# Patient Record
Sex: Male | Born: 1980 | Race: Black or African American | Hispanic: No | Marital: Single | Smoking: Never smoker
Health system: Southern US, Community
[De-identification: ages and names within clinical notes are randomized; demographics above are authoritative.]

---

## 2002-07-11 ENCOUNTER — Emergency Department (HOSPITAL_COMMUNITY): Admission: EM | Admit: 2002-07-11 | Discharge: 2002-07-11 | Payer: Self-pay | Admitting: Emergency Medicine

## 2002-11-01 ENCOUNTER — Emergency Department (HOSPITAL_COMMUNITY): Admission: EM | Admit: 2002-11-01 | Discharge: 2002-11-02 | Payer: Self-pay | Admitting: Emergency Medicine

## 2004-12-15 ENCOUNTER — Emergency Department (HOSPITAL_COMMUNITY): Admission: EM | Admit: 2004-12-15 | Discharge: 2004-12-15 | Payer: Self-pay | Admitting: Emergency Medicine

## 2006-02-18 ENCOUNTER — Emergency Department (HOSPITAL_COMMUNITY): Admission: EM | Admit: 2006-02-18 | Discharge: 2006-02-19 | Payer: Self-pay | Admitting: Emergency Medicine

## 2011-12-05 ENCOUNTER — Emergency Department (HOSPITAL_COMMUNITY)
Admission: EM | Admit: 2011-12-05 | Discharge: 2011-12-05 | Disposition: A | Discharge status: Home / Self Care | Payer: Self-pay | Attending: Emergency Medicine | Admitting: Emergency Medicine

## 2011-12-05 ENCOUNTER — Encounter (HOSPITAL_COMMUNITY): Payer: Self-pay | Admitting: Emergency Medicine

## 2011-12-05 DIAGNOSIS — S0101XA Laceration without foreign body of scalp, initial encounter: Secondary | ICD-10-CM

## 2011-12-05 DIAGNOSIS — Y9229 Other specified public building as the place of occurrence of the external cause: Secondary | ICD-10-CM | POA: Insufficient documentation

## 2011-12-05 DIAGNOSIS — W268XXA Contact with other sharp object(s), not elsewhere classified, initial encounter: Secondary | ICD-10-CM | POA: Insufficient documentation

## 2011-12-05 DIAGNOSIS — S0100XA Unspecified open wound of scalp, initial encounter: Secondary | ICD-10-CM | POA: Insufficient documentation

## 2011-12-05 NOTE — ED Notes (Signed)
Pt. Discharged to home NAD noted 
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
## 2011-12-05 NOTE — ED Notes (Signed)
Pt presented to the ER with c/o laceration to the left side behind the ear, bleeding controled, laceration is 3 cm, noted knot to the affected area as well.

## 2011-12-05 NOTE — ED Notes (Signed)
Left side head laceration

## 2011-12-05 NOTE — Discharge Instructions (Signed)
You were seen and treated today for your scalp laceration. Your wound was cleaned with irrigation. Your wound was also stapled to help control bleeding and help with healing. He'll need to have your staples removed in 10 days. Ear, an urgent care clinic or return to the emergency room to have your staples removed. If you develop any bleeding, drainage or discharge from a laceration, increased pain, fever, chills please return to emergency room sooner for possible infection.   Laceration Care, Adult A laceration is a cut or lesion that goes through all layers of the skin and into the tissue just beneath the skin. TREATMENT  Some lacerations may not require closure. Some lacerations may not be able to be closed due to an increased risk of infection. It is important to see your caregiver as soon as possible after an injury to minimize the risk of infection and maximize the opportunity for successful closure. If closure is appropriate, pain medicines may be given, if needed. The wound will be cleaned to help prevent infection. Your caregiver will use stitches (sutures), staples, wound glue (adhesive), or skin adhesive strips to repair the laceration. These tools bring the skin edges together to allow for faster healing and a better cosmetic outcome. However, all wounds will heal with a scar. Once the wound has healed, scarring can be minimized by covering the wound with sunscreen during the day for 1 full year. HOME CARE INSTRUCTIONS  For sutures or staples:  Keep the wound clean and dry.   If you were given a bandage (dressing), you should change it at least once a day. Also, change the dressing if it becomes wet or dirty, or as directed by your caregiver.   Wash the wound with soap and water 2 times a day. Rinse the wound off with water to remove all soap. Pat the wound dry with a clean towel.   After cleaning, apply a thin layer of the antibiotic ointment as recommended by your caregiver. This will  help prevent infection and keep the dressing from sticking.   You may shower as usual after the first 24 hours. Do not soak the wound in water until the sutures are removed.   Only take over-the-counter or prescription medicines for pain, discomfort, or fever as directed by your caregiver.   Get your sutures or staples removed as directed by your caregiver.  For skin adhesive strips:  Keep the wound clean and dry.   Do not get the skin adhesive strips wet. You may bathe carefully, using caution to keep the wound dry.   If the wound gets wet, pat it dry with a clean towel.   Skin adhesive strips will fall off on their own. You may trim the strips as the wound heals. Do not remove skin adhesive strips that are still stuck to the wound. They will fall off in time.  For wound adhesive:  You may briefly wet your wound in the shower or bath. Do not soak or scrub the wound. Do not swim. Avoid periods of heavy perspiration until the skin adhesive has fallen off on its own. After showering or bathing, gently pat the wound dry with a clean towel.   Do not apply liquid medicine, cream medicine, or ointment medicine to your wound while the skin adhesive is in place. This may loosen the film before your wound is healed.   If a dressing is placed over the wound, be careful not to apply tape directly over the skin adhesive.  This may cause the adhesive to be pulled off before the wound is healed.   Avoid prolonged exposure to sunlight or tanning lamps while the skin adhesive is in place. Exposure to ultraviolet light in the first year will darken the scar.   The skin adhesive will usually remain in place for 5 to 10 days, then naturally fall off the skin. Do not pick at the adhesive film.  You may need a tetanus shot if:  You cannot remember when you had your last tetanus shot.   You have never had a tetanus shot.  If you get a tetanus shot, your arm may swell, get red, and feel warm to the touch.  This is common and not a problem. If you need a tetanus shot and you choose not to have one, there is a rare chance of getting tetanus. Sickness from tetanus can be serious. SEEK MEDICAL CARE IF:   You have redness, swelling, or increasing pain in the wound.   You see a red line that goes away from the wound.   You have yellowish-white fluid (pus) coming from the wound.   You have a fever.   You notice a bad smell coming from the wound or dressing.   Your wound breaks open before or after sutures have been removed.   You notice something coming out of the wound such as wood or glass.   Your wound is on your hand or foot and you cannot move a finger or toe.  SEEK IMMEDIATE MEDICAL CARE IF:   Your pain is not controlled with prescribed medicine.   You have severe swelling around the wound causing pain and numbness or a change in color in your arm, hand, leg, or foot.   Your wound splits open and starts bleeding.   You have worsening numbness, weakness, or loss of function of any joint around or beyond the wound.   You develop painful lumps near the wound or on the skin anywhere on your body.  MAKE SURE YOU:   Understand these instructions.   Will watch your condition.   Will get help right away if you are not doing well or get worse.  Document Released: 08/10/2005 Document Revised: 07/30/2011 Document Reviewed: 02/03/2011 [facility] Patient Information 2012 [place], [place].   Staple Wound Closure Your wound has been cleaned and closed with skin staples. HOME CARE  Keep the area around the staples clean and dry.   Rest and raise (elevate) the injured part above the level of your heart.   See your doctor for a follow-up check of the wound.   See your doctor to have the staples removed.   As the wound heals, you may leave it open to the air and clean it daily with water.   Do not soak the wound in water for long periods of time.   Watch for signs of a wound infection:    Unusual redness or puffiness around the wound.   Increasing pain or tenderness.   Yellowish white fluid (pus) coming from the wound.  You may need a tetanus shot if:  You cannot remember when you had your last tetanus shot.   You have never had a tetanus shot.   The injury broke your skin.  If you need a tetanus shot and you choose not to have one, you may get tetanus. Sickness from tetanus can be serious. GET HELP RIGHT AWAY IF:   You think the wound is infected.   The wound  does not stay together after the staples have been taken out.   Something comes out of the wound, such as wood or glass.   You or your child has problems moving the injured area.   You or your child has a temperature by mouth above 102 F (38.9 C), not controlled by medicine.  MAKE SURE YOU:   Understand these instructions.   Will watch this condition.   Will get help right away if you or your child is not doing well or gets worse.  Document Released: 05/19/2008 Document Revised: 07/30/2011 Document Reviewed: 08/08/2008 [facility] Patient Information 2012 [place], [place].

## 2011-12-05 NOTE — ED Notes (Signed)
Received pt. From triage, pt. Alert and oriented, pt. Denies LOC, NAD noted

## 2011-12-06 NOTE — ED Provider Notes (Signed)
Medical screening examination/treatment/procedure(s) were performed by non-physician practitioner and as supervising physician I was immediately available for consultation/collaboration.   Carlissa Pesola D Eberardo Demello, MD 12/06/11 0951 

## 2015-11-03 ENCOUNTER — Encounter (HOSPITAL_COMMUNITY): Payer: Self-pay | Admitting: *Deleted

## 2015-11-03 ENCOUNTER — Emergency Department (INDEPENDENT_AMBULATORY_CARE_PROVIDER_SITE_OTHER)
Admission: EM | Admit: 2015-11-03 | Discharge: 2015-11-03 | Disposition: A | Discharge status: Home / Self Care | Payer: Self-pay | Source: Home / Self Care

## 2015-11-03 DIAGNOSIS — M545 Low back pain, unspecified: Secondary | ICD-10-CM

## 2015-11-03 MED ORDER — KETOROLAC TROMETHAMINE 60 MG/2ML IM SOLN
60.0000 mg | Freq: Once | INTRAMUSCULAR | Status: AC
  Administered 2015-11-03: 60 mg via INTRAMUSCULAR

## 2015-11-03 MED ORDER — KETOROLAC TROMETHAMINE 60 MG/2ML IM SOLN
INTRAMUSCULAR | Status: AC
  Filled 2015-11-03: qty 2

## 2015-11-03 MED ORDER — METHYLPREDNISOLONE 4 MG PO TBPK: ORAL_TABLET | ORAL | Status: AC

## 2015-11-03 MED ORDER — CYCLOBENZAPRINE HCL 10 MG PO TABS: 10.0000 mg | ORAL_TABLET | Freq: Two times a day (BID) | ORAL | Status: AC | PRN
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
## 2015-12-01 ENCOUNTER — Encounter (HOSPITAL_COMMUNITY): Payer: Self-pay | Admitting: *Deleted

## 2015-12-01 ENCOUNTER — Ambulatory Visit (HOSPITAL_COMMUNITY)
Admission: EM | Admit: 2015-12-01 | Discharge: 2015-12-01 | Disposition: A | Discharge status: Home / Self Care | Payer: No Typology Code available for payment source | Attending: Family Medicine | Admitting: Family Medicine

## 2015-12-01 MED ORDER — METAXALONE 800 MG PO TABS: 800.0000 mg | ORAL_TABLET | Freq: Three times a day (TID) | ORAL | Status: AC

## 2015-12-01 MED ORDER — DICLOFENAC POTASSIUM 50 MG PO TABS: 50.0000 mg | ORAL_TABLET | Freq: Three times a day (TID) | ORAL | Status: AC
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
## 2015-12-01 NOTE — Discharge Instructions (Signed)
See orthopedist when possible and take medicine as prescribed.
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
## 2015-12-05 ENCOUNTER — Other Ambulatory Visit (HOSPITAL_COMMUNITY): Payer: Self-pay | Admitting: Sports Medicine

## 2015-12-05 DIAGNOSIS — M545 Low back pain: Secondary | ICD-10-CM

## 2015-12-16 ENCOUNTER — Ambulatory Visit (HOSPITAL_COMMUNITY)
Admission: RE | Admit: 2015-12-16 | Discharge: 2015-12-16 | Disposition: A | Discharge status: Home / Self Care | Payer: No Typology Code available for payment source | Source: Ambulatory Visit | Attending: Sports Medicine | Admitting: Sports Medicine

## 2015-12-16 DIAGNOSIS — M5116 Intervertebral disc disorders with radiculopathy, lumbar region: Secondary | ICD-10-CM | POA: Insufficient documentation

## 2015-12-16 DIAGNOSIS — M4806 Spinal stenosis, lumbar region: Secondary | ICD-10-CM | POA: Insufficient documentation

## 2015-12-16 DIAGNOSIS — M545 Low back pain: Secondary | ICD-10-CM | POA: Insufficient documentation

## 2016-07-02 ENCOUNTER — Encounter (HOSPITAL_COMMUNITY): Payer: Self-pay | Admitting: Emergency Medicine

## 2016-07-02 ENCOUNTER — Emergency Department (HOSPITAL_COMMUNITY)
Admission: EM | Admit: 2016-07-02 | Discharge: 2016-07-02 | Disposition: A | Discharge status: Home / Self Care | Payer: No Typology Code available for payment source | Attending: Emergency Medicine | Admitting: Emergency Medicine

## 2016-07-02 ENCOUNTER — Emergency Department (HOSPITAL_COMMUNITY): Payer: No Typology Code available for payment source

## 2016-07-02 DIAGNOSIS — Y92410 Unspecified street and highway as the place of occurrence of the external cause: Secondary | ICD-10-CM | POA: Insufficient documentation

## 2016-07-02 DIAGNOSIS — S8992XA Unspecified injury of left lower leg, initial encounter: Secondary | ICD-10-CM | POA: Diagnosis present

## 2016-07-02 DIAGNOSIS — Y939 Activity, unspecified: Secondary | ICD-10-CM | POA: Insufficient documentation

## 2016-07-02 DIAGNOSIS — Y999 Unspecified external cause status: Secondary | ICD-10-CM | POA: Diagnosis not present

## 2016-07-02 MED ORDER — OXYCODONE-ACETAMINOPHEN 5-325 MG PO TABS
1.0000 | ORAL_TABLET | Freq: Once | ORAL | Status: AC
  Administered 2016-07-02: 1 via ORAL
  Filled 2016-07-02: qty 1

## 2016-07-02 MED ORDER — KETOROLAC TROMETHAMINE 60 MG/2ML IM SOLN
60.0000 mg | Freq: Once | INTRAMUSCULAR | Status: AC
  Administered 2016-07-02: 60 mg via INTRAMUSCULAR
  Filled 2016-07-02: qty 2

## 2016-07-02 MED ORDER — OXYCODONE-ACETAMINOPHEN 5-325 MG PO TABS: 1.0000 | ORAL_TABLET | Freq: Four times a day (QID) | ORAL | 0 refills | Status: AC | PRN

## 2016-07-02 MED ORDER — NAPROXEN 500 MG PO TABS: 500.0000 mg | ORAL_TABLET | Freq: Two times a day (BID) | ORAL | 0 refills | Status: AC
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
## 2016-07-02 NOTE — Discharge Instructions (Signed)
Expect your soreness to increase over the next 2-3 days. Take it easy, but do not lay around too much as this may make the stiffness worse. Take 500 mg of naproxen every 12 hours or 800 mg of ibuprofen every 8 hours for the next 3 days. Take these medications with food to avoid upset stomach. Percocet for severe pain. Do not take Percocet while driving or performing other dangerous activities.  Follow-up with the orthopedic surgeon as soon as possible. Call the number provided to set up an appointment. It is recommended that you remain nonweightbearing on this knee until assessed by the orthopedic surgeon.
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
